# Patient Record
Sex: Male | Born: 2000 | Race: White | Hispanic: No | Marital: Single | Smoking: Former smoker
Health system: Southern US, Community
[De-identification: ages and names within clinical notes are randomized; demographics above are authoritative.]

## PROBLEM LIST (undated history)

## (undated) DIAGNOSIS — F988 Other specified behavioral and emotional disorders with onset usually occurring in childhood and adolescence: Secondary | ICD-10-CM

## (undated) DIAGNOSIS — K5909 Other constipation: Secondary | ICD-10-CM

## (undated) DIAGNOSIS — J45909 Unspecified asthma, uncomplicated: Secondary | ICD-10-CM

## (undated) HISTORY — DX: Other constipation: K59.09

## (undated) HISTORY — DX: Unspecified asthma, uncomplicated: J45.909

## (undated) HISTORY — DX: Other specified behavioral and emotional disorders with onset usually occurring in childhood and adolescence: F98.8

---
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
## 2018-04-11 ENCOUNTER — Encounter: Payer: Self-pay | Admitting: Nurse Practitioner

## 2018-04-11 ENCOUNTER — Ambulatory Visit (INDEPENDENT_AMBULATORY_CARE_PROVIDER_SITE_OTHER): Payer: No Typology Code available for payment source | Admitting: Nurse Practitioner

## 2018-04-11 VITALS — BP 120/78 | Temp 98.5°F | Ht 69.0 in | Wt 132.0 lb

## 2018-04-11 DIAGNOSIS — J069 Acute upper respiratory infection, unspecified: Secondary | ICD-10-CM | POA: Diagnosis not present

## 2018-04-11 DIAGNOSIS — B9689 Other specified bacterial agents as the cause of diseases classified elsewhere: Secondary | ICD-10-CM | POA: Diagnosis not present

## 2018-04-11 MED ORDER — AZITHROMYCIN 250 MG PO TABS: ORAL_TABLET | ORAL | 0 refills | Status: DC

## 2018-04-11 NOTE — Progress Notes (Signed)
Subjective: Presents for complaints of congestion and cough for the past 3 to 4 days.  His mother is present today per his request.  No fever.  Sore throat.  Headache.  Runny nose.  Frequent cough producing white to yellow sputum.  Slight chest burning with cough.  Minimal wheezing.  No ear pain.  No vomiting diarrhea or abdominal pain.  No reflux symptoms.  Objective:   BP 120/78   Temp 98.5 F (36.9 C)   Ht 5\' 9"  (1.753 m)   Wt 132 lb 0.2 oz (59.9 kg)   BMI 19.49 kg/m  NAD.  Alert, oriented.  TMs clear effusion, no erythema.  Pharynx mildly injected with PND noted.  Neck supple with mild soft anterior adenopathy.  Lungs clear.  Heart regular rate and rhythm.  Abdomen soft nontender.  Assessment:  Bacterial upper respiratory infection    Plan:   Meds ordered this encounter  Medications  . azithromycin (ZITHROMAX Z-PAK) 250 MG tablet    Sig: Take 2 tablets (500 mg) on  Day 1,  followed by 1 tablet (250 mg) once daily on Days 2 through 5.    Dispense:  6 each    Refill:  0    Order Specific Question:   Supervising Provider    Answer:   Merlyn AlbertLUKING, WILLIAM S [[id number]]   OTC meds as directed for congestion and cough.  Call back if symptoms worsen or persist.

## 2018-05-22 ENCOUNTER — Ambulatory Visit (INDEPENDENT_AMBULATORY_CARE_PROVIDER_SITE_OTHER): Payer: Self-pay | Admitting: Family Medicine

## 2018-05-22 ENCOUNTER — Encounter: Payer: Self-pay | Admitting: Family Medicine

## 2018-05-22 VITALS — BP 110/72 | Temp 97.7°F | Ht 69.0 in | Wt 129.2 lb

## 2018-05-22 DIAGNOSIS — R21 Rash and other nonspecific skin eruption: Secondary | ICD-10-CM

## 2018-05-22 DIAGNOSIS — W57XXXA Bitten or stung by nonvenomous insect and other nonvenomous arthropods, initial encounter: Secondary | ICD-10-CM

## 2018-05-22 MED ORDER — TRIAMCINOLONE ACETONIDE 0.1 % EX CREA: TOPICAL_CREAM | CUTANEOUS | 1 refills | Status: DC

## 2018-05-22 MED ORDER — PREDNISONE 20 MG PO TABS: ORAL_TABLET | ORAL | 0 refills | Status: DC

## 2018-05-22 NOTE — Progress Notes (Signed)
   Subjective:    Patient ID: Jeff Moreno, male    DOB: 02-28-2001, 17 y.o.   MRN: [id number]  Rash  This is a new problem. The current episode started in the past 7 days. The affected locations include the left foot, left ankle, left lower leg, right lower leg, right ankle and right foot. The rash is characterized by redness and itchiness. Past treatments include nothing.   Rash started Saturday to bilateral feet, ankles, and lower legs and including in between toes. Denies rash in any other locations. Reports significant pruritus. No one else in the home with same. Reports hiking this weekend. Has not tried anything at home for treatment.   Review of Systems  Skin: Positive for rash.       Objective:   Physical Exam  Constitutional: He is oriented to person, place, and time. He appears well-developed and well-nourished. No distress.  HENT:  Head: Normocephalic and atraumatic.  Eyes: Right eye exhibits no discharge. Left eye exhibits no discharge.  Neck: Neck supple.  Neurological: He is alert and oriented to person, place, and time.  Skin: Skin is warm and dry.  Multiple discrete small bug bites noted in webspace between toes, around bilateral feet and ankles and lower legs. No signs of infection noted.   Psychiatric: He has a normal mood and affect.  Vitals reviewed.      Assessment & Plan:  Bug bite, initial encounter - Plan: predniSONE (DELTASONE) 20 MG tablet, triamcinolone cream (KENALOG) 0.1 %  Given the presentation and history, likely from straw mites.  Rx sent in for prednisone taper and kenalog cream for itching.  Discussed that itching may take a few days to resolve.  Educated on wearing protective DEET bug spray next time he is outside in the woods or hiking, pt verbalized understanding. F/u prn. As attending physician to this patient visit, this patient was seen in conjunction with the nurse practitioner.  The history,physical and treatment plan was reviewed with  the nurse practitioner and pertinent findings were verified with the patient.  Also the treatment plan was reviewed with the patient while they were present.

## 2018-11-14 ENCOUNTER — Encounter: Payer: Self-pay | Admitting: Family Medicine

## 2018-11-14 ENCOUNTER — Ambulatory Visit (INDEPENDENT_AMBULATORY_CARE_PROVIDER_SITE_OTHER): Payer: Self-pay | Admitting: Family Medicine

## 2018-11-14 VITALS — BP 108/80 | Ht 70.0 in | Wt 141.0 lb

## 2018-11-14 DIAGNOSIS — F411 Generalized anxiety disorder: Secondary | ICD-10-CM

## 2018-11-14 MED ORDER — VENLAFAXINE HCL ER 150 MG PO CP24: 150.0000 mg | ORAL_CAPSULE | Freq: Every day | ORAL | 4 refills | Status: DC

## 2018-11-14 MED ORDER — TRAZODONE HCL 50 MG PO TABS: 50.0000 mg | ORAL_TABLET | Freq: Every day | ORAL | 4 refills | Status: DC

## 2018-11-14 NOTE — Progress Notes (Signed)
   Subjective:    Patient ID: Jeff Moreno, male    DOB: August 16, 2001, 18 y.o.   MRN: [id number]  HPI Patient is here today to discuss medications that the [employer] placed him on. This patient had problems with depression of moods when he was in the [employer] he has been discharged he states he is doing much better now he does admit to occasionally smoking marijuana but denies abusing any drugs He states he was started on Venlafaxine 150 mg one daily for moods, and also Trazodone 50 mg one po Qhs for mood disorders.  He states he is currently taking these. He is no longer in [employer], and wanted advise as to whether or not he should continue these, and needs refills.  Review of Systems  Constitutional: Negative for activity change, appetite change and fatigue.  HENT: Negative for congestion and rhinorrhea.   Respiratory: Negative for cough and shortness of breath.   Cardiovascular: Negative for chest pain and leg swelling.  Gastrointestinal: Negative for abdominal pain, nausea and vomiting.  Neurological: Negative for dizziness and headaches.  Psychiatric/Behavioral: Negative for agitation and behavioral problems.       Objective:   Physical Exam Vitals signs reviewed.  Cardiovascular:     Rate and Rhythm: Normal rate and regular rhythm.     Heart sounds: Normal heart sounds. No murmur.  Pulmonary:     Effort: Pulmonary effort is normal.     Breath sounds: Normal breath sounds.  Lymphadenopathy:     Cervical: No cervical adenopathy.  Neurological:     Mental Status: He is alert.  Psychiatric:        Behavior: Behavior normal.           Assessment & Plan:  Generalized anxiety disorder with some element of depression doing much better now continue medication in 3 months reassess I recommend he reduce trazodone down to half a tablet each evening if he feels he is having any setbacks problems or become suicidal or overly anxious or depressed to follow-up immediately

## 2019-02-10 ENCOUNTER — Encounter (HOSPITAL_COMMUNITY): Payer: Self-pay | Admitting: *Deleted

## 2019-02-10 ENCOUNTER — Emergency Department (HOSPITAL_COMMUNITY)
Admission: EM | Admit: 2019-02-10 | Discharge: 2019-02-10 | Disposition: A | Discharge status: Home / Self Care | Payer: No Typology Code available for payment source | Attending: Emergency Medicine | Admitting: Emergency Medicine

## 2019-02-10 ENCOUNTER — Other Ambulatory Visit: Payer: Self-pay

## 2019-02-10 DIAGNOSIS — A084 Viral intestinal infection, unspecified: Secondary | ICD-10-CM | POA: Insufficient documentation

## 2019-02-10 DIAGNOSIS — R112 Nausea with vomiting, unspecified: Secondary | ICD-10-CM

## 2019-02-10 DIAGNOSIS — R197 Diarrhea, unspecified: Secondary | ICD-10-CM | POA: Insufficient documentation

## 2019-02-10 LAB — BASIC METABOLIC PANEL
Anion gap: 14 (ref 5–15)
BUN: 19 mg/dL (ref 6–20)
CO2: 21 mmol/L — ABNORMAL LOW (ref 22–32)
Calcium: 9.7 mg/dL (ref 8.9–10.3)
Chloride: 102 mmol/L (ref 98–111)
Creatinine, Ser: 1.08 mg/dL (ref 0.61–1.24)
GFR calc Af Amer: >60 mL/min (ref >60)
GFR calc non Af Amer: >60 mL/min (ref >60)
Glucose, Bld: 117 mg/dL — ABNORMAL HIGH (ref 70–99)
Potassium: 4.2 mmol/L (ref 3.5–5.1)
Sodium: 137 mmol/L (ref 135–145)

## 2019-02-10 LAB — CBC
HCT: 45.6 % (ref 39.0–52.0)
Hemoglobin: 15.7 g/dL (ref 13.0–17.0)
MCH: 28.4 pg (ref 26.0–34.0)
MCHC: 34.4 g/dL (ref 30.0–36.0)
MCV: 82.6 fL (ref 80.0–100.0)
Platelets: 190 10*3/uL (ref 150–400)
RBC: 5.52 MIL/uL (ref 4.22–5.81)
RDW: 13 % (ref 11.5–15.5)
WBC: 8.9 10*3/uL (ref 4.0–10.5)
nRBC: 0 % (ref 0.0–0.2)

## 2019-02-10 MED ORDER — SODIUM CHLORIDE 0.9 % IV BOLUS: 1000.0000 mL | Freq: Once | INTRAVENOUS | Status: DC

## 2019-02-10 MED ORDER — METFORMIN HCL 500 MG PO TABS: 500.0000 mg | ORAL_TABLET | Freq: Once | ORAL | Status: DC

## 2019-02-10 NOTE — ED Notes (Signed)
Pt given gingerale- tolerating well 

## 2019-02-10 NOTE — ED Triage Notes (Signed)
Pt unable to keep anything down for past 2 days. + emesis and diarrhea, denies sick contacts

## 2019-02-10 NOTE — Discharge Instructions (Addendum)
It was our pleasure to provide your ER care today - we hope that you feel better.  Drink plenty of fluids.  Follow up with primary care doctor in the next few days if symptoms fail to improve/resolve.  Return to ER if worse, new symptoms, fevers, persistent vomiting, new or severe pain, other concern.

## 2019-02-10 NOTE — ED Provider Notes (Signed)
[facility] Provider Note   CSN: [id number] Arrival date & time: 02/10/19  [id number]    History   Chief Complaint Chief Complaint  Patient presents with  . Emesis    HPI Geanie Berlindam E Akhavan is a 18 y.o. male.     Patient c/o nausea, vomiting, and diarrhea, for the past 2 days. Symptoms acute onset, moderate, episodic, persistent. Emesis clear or color recently ingested fluids, not bloody or bilious. Diarrhea watery. Had mild intermittent diffuse abd cramping - that is currently resolved. No dysuria or gu c/o. No known ill contacts or known covid+ exposure. No known bad food ingestion. No recent abx or new med use. No fever or chills.   The history is provided by the patient.  Emesis  Associated symptoms: diarrhea   Associated symptoms: no cough, no fever, no headaches and no sore throat     Past Medical History:  Diagnosis Date  . ADD (attention deficit disorder)   . Asthma   . Chronic constipation     Patient Active Problem List   Diagnosis Date Noted  . Insomnia 04/17/2017  . Headache, common migraine 11/08/2015  . ADD (attention deficit disorder) 08/10/2015  . Acne vulgaris 05/19/2013    History reviewed. No pertinent surgical history.      Home Medications    Prior to Admission medications   Medication Sig Start Date End Date Taking? Authorizing Provider  azithromycin (ZITHROMAX Z-PAK) 250 MG tablet Take 2 tablets (500 mg) on  Day 1,  followed by 1 tablet (250 mg) once daily on Days 2 through 5. Patient not taking: Reported on 05/22/2018 04/11/18   Campbell RichesHoskins, Carolyn C, NP  predniSONE (DELTASONE) 20 MG tablet Take 3 tablets per day for 3 days, then 2 tablets per day for 3 days, then 1 tablet per day for 3 days. Patient not taking: Reported on 11/14/2018 05/22/18   Jeannine BogaWeekley, Lindsay, NP  traZODone (DESYREL) 50 MG tablet Take 1 tablet (50 mg total) by mouth at bedtime. 11/14/18   Babs SciaraLuking, Scott A, MD  triamcinolone cream (KENALOG) 0.1 % May apply to  affected area up to 2-3 times per day. Patient not taking: Reported on 11/14/2018 05/22/18   Jeannine BogaWeekley, Lindsay, NP  venlafaxine XR (EFFEXOR-XR) 150 MG 24 hr capsule Take 1 capsule (150 mg total) by mouth daily with breakfast. 11/14/18   Babs SciaraLuking, Scott A, MD    Family History History reviewed. No pertinent family history.  Social History Social History   Tobacco Use  . Smoking status: Former Games developermoker  . Smokeless tobacco: Never Used  Substance Use Topics  . Alcohol use: No  . Drug use: No     Allergies   Duricef [cefadroxil]   Review of Systems Review of Systems  Constitutional: Negative for fever.  HENT: Negative for sore throat.   Eyes: Negative for redness.  Respiratory: Negative for cough and shortness of breath.   Cardiovascular: Negative for chest pain.  Gastrointestinal: Positive for diarrhea and vomiting.  Genitourinary: Negative for dysuria and flank pain.  Musculoskeletal: Negative for back pain and neck pain.  Skin: Negative for rash.  Neurological: Negative for headaches.  Hematological: Does not bruise/bleed easily.  Psychiatric/Behavioral: Negative for confusion.     Physical Exam Updated Vital Signs BP (!) 134/92 (BP Location: Right Arm)   Pulse 69   Temp 98.1 F (36.7 C) (Temporal)   Resp 14   Ht 1.778 m (5\' 10" )   Wt 56.7 kg   SpO2 99%   BMI  17.94 kg/m   Physical Exam Vitals signs and nursing note reviewed.  Constitutional:      Appearance: Normal appearance. He is well-developed.  HENT:     Head: Atraumatic.     Nose: Nose normal.     Mouth/Throat:     Mouth: Mucous membranes are moist.     Pharynx: Oropharynx is clear.  Eyes:     General: No scleral icterus.    Conjunctiva/sclera: Conjunctivae normal.     Pupils: Pupils are equal, round, and reactive to light.  Neck:     Musculoskeletal: Normal range of motion and neck supple. No neck rigidity.     Trachea: No tracheal deviation.  Cardiovascular:     Rate and Rhythm: Normal rate and  regular rhythm.     Pulses: Normal pulses.     Heart sounds: Normal heart sounds. No murmur. No friction rub. No gallop.   Pulmonary:     Effort: Pulmonary effort is normal. No accessory muscle usage or respiratory distress.     Breath sounds: Normal breath sounds.  Abdominal:     General: Bowel sounds are normal. There is no distension.     Palpations: Abdomen is soft. There is no mass.     Tenderness: There is no abdominal tenderness. There is no guarding or rebound.     Hernia: No hernia is present.  Genitourinary:    Comments: No cva tenderness. Musculoskeletal:        General: No swelling.  Skin:    General: Skin is warm and dry.     Findings: No rash.  Neurological:     Mental Status: He is alert.     Comments: Alert, speech clear. Steady gait.  Psychiatric:        Mood and Affect: Mood normal.      ED Treatments / Results  Labs (all labs ordered are listed, but only abnormal results are displayed) Results for orders placed or performed during the hospital encounter of 02/10/19  CBC  Result Value Ref Range   WBC 8.9 4.0 - 10.5 K/uL   RBC 5.52 4.22 - 5.81 MIL/uL   Hemoglobin 15.7 13.0 - 17.0 g/dL   HCT 67.6 72.0 - 94.7 %   MCV 82.6 80.0 - 100.0 fL   MCH 28.4 26.0 - 34.0 pg   MCHC 34.4 30.0 - 36.0 g/dL   RDW 09.6 28.3 - 66.2 %   Platelets 190 150 - 400 K/uL   nRBC 0.0 0.0 - 0.2 %  Basic metabolic panel  Result Value Ref Range   Sodium 137 135 - 145 mmol/L   Potassium 4.2 3.5 - 5.1 mmol/L   Chloride 102 98 - 111 mmol/L   CO2 21 (L) 22 - 32 mmol/L   Glucose, Bld 117 (H) 70 - 99 mg/dL   BUN 19 6 - 20 mg/dL   Creatinine, Ser 9.47 0.61 - 1.24 mg/dL   Calcium 9.7 8.9 - 65.4 mg/dL   GFR calc non Af Amer >60 >60 mL/min   GFR calc Af Amer >60 >60 mL/min   Anion gap 14 5 - 15    EKG None  Radiology No results found.  Procedures Procedures (including critical care time)  Medications Ordered in ED Medications  sodium chloride 0.9 % bolus 1,000 mL (has no  administration in time range)     Initial Impression / Assessment and Plan / ED Course  I have reviewed the triage vital signs and the nursing notes.  Pertinent labs & imaging  results that were available during my care of the patient were reviewed by me and considered in my medical decision making (see chart for details).  Iv ns bolus. Labs sent.   Reviewed nursing notes and prior charts for additional history.   Trial of po fluids.  Labs reviewed by me - chem normal.   Patient is tolerating po fluids. No recurrent nvd. Afebrile. abd soft nt.  Pt appears stable for d/c.   Return precautions provided.     Final Clinical Impressions(s) / ED Diagnoses   Final diagnoses:  None    ED Discharge Orders    None       Cathren Laine, MD 02/10/19 2052

## 2019-02-11 ENCOUNTER — Encounter: Payer: Self-pay | Admitting: Family Medicine

## 2019-02-11 ENCOUNTER — Telehealth: Payer: Self-pay | Admitting: *Deleted

## 2019-02-11 ENCOUNTER — Ambulatory Visit (INDEPENDENT_AMBULATORY_CARE_PROVIDER_SITE_OTHER): Payer: Self-pay | Admitting: Family Medicine

## 2019-02-11 DIAGNOSIS — B349 Viral infection, unspecified: Secondary | ICD-10-CM

## 2019-02-11 DIAGNOSIS — Z20822 Contact with and (suspected) exposure to covid-19: Secondary | ICD-10-CM

## 2019-02-11 MED ORDER — ONDANSETRON 4 MG PO TBDP: 4.0000 mg | ORAL_TABLET | Freq: Three times a day (TID) | ORAL | 0 refills | Status: DC | PRN

## 2019-02-11 NOTE — Progress Notes (Signed)
   Subjective:    Patient ID: Jeff Moreno, male    DOB: 19-Aug-2001, 18 y.o.   MRN: [id number] Audio plus video Emesis   This is a new problem. Episode onset: 2 days ago. Maximum temperature: 99.9. Associated symptoms include abdominal pain, chills, diarrhea and a fever. He has tried nothing for the symptoms.   Virtual Visit via Video Note  I connected with Jeff Moreno on 02/11/19 at  2:00 PM EDT by a video enabled telemedicine application and verified that I am speaking with the correct person using two identifiers.  Location: Patient: home Provider: office   I discussed the limitations of evaluation and management by telemedicine and the availability of in person appointments. The patient expressed understanding and agreed to proceed.  History of Present Illness:    Observations/Objective:   Assessment and Plan:   Follow Up Instructions:    I discussed the assessment and treatment plan with the patient. The patient was provided an opportunity to ask questions and all were answered. The patient agreed with the plan and demonstrated an understanding of the instructions.   The patient was advised to call back or seek an in-person evaluation if the symptoms worsen or if the condition fails to improve as anticipated.  I provided 18 minutes of non-face-to-face time during this encounter.  Mostly GI symptoms.  Diminished energy.  Low-grade fever.  Nausea.  Abdominal cramps.  Diarrhea.  2-1/2 days duration.  Around no one else sick. Marlowe Sh[place], LPN    Review of Systems  Constitutional: Positive for chills and fever.  Gastrointestinal: Positive for abdominal pain, diarrhea and vomiting.       Objective:   Physical Exam   Virtual     Assessment & Plan:  Impression probable viral GE presentation.  However his workplace is insisting on coronavirus test.  We will press on and do this symptom care discussed Zofran as needed for diarrhea.  Warning signs discussed

## 2019-02-11 NOTE — Telephone Encounter (Signed)
Pt scheduled for Covid 19 testing (02/12/19 at 0945 at [facility] in [place]. Pt instructed that he needs to stay in car for the testing and that he needs to wear mask. Pt advised that since he is self pay, he will receive a bill from [facility]. Pt's MyChart reactivated. Order placed.

## 2019-02-11 NOTE — Telephone Encounter (Signed)
Patient seen by virtual visit by Dr Lubertha South- cough, gi symptoms,fever- needs Covid 19 testing before can return to work  Phone [phone]

## 2019-02-12 ENCOUNTER — Other Ambulatory Visit: Payer: Self-pay

## 2019-02-12 DIAGNOSIS — Z20822 Contact with and (suspected) exposure to covid-19: Secondary | ICD-10-CM

## 2019-02-13 LAB — NOVEL CORONAVIRUS, NAA: SARS-CoV-2, NAA: NOT DETECTED

## 2019-02-15 ENCOUNTER — Ambulatory Visit: Payer: Self-pay | Admitting: Family Medicine

## 2019-02-19 ENCOUNTER — Telehealth: Payer: Self-pay | Admitting: Family Medicine

## 2019-02-19 ENCOUNTER — Encounter: Payer: Self-pay | Admitting: Family Medicine

## 2019-02-19 NOTE — Telephone Encounter (Signed)
Test is negative may return to work

## 2019-02-19 NOTE — Telephone Encounter (Signed)
Done. Mom picked up note

## 2019-02-19 NOTE — Telephone Encounter (Signed)
Pt's mom calling to see if Covid results are back. Pt is needing to go back to work.

## 2019-02-19 NOTE — Telephone Encounter (Signed)
Pt contacted and verbalized understanding. Pt is needing note to return to work stating that test is negative. Please advise. Thank you

## 2019-02-19 NOTE — Telephone Encounter (Signed)
Please advise. Thank you

## 2019-05-10 ENCOUNTER — Other Ambulatory Visit: Payer: Self-pay

## 2019-05-10 DIAGNOSIS — Z20822 Contact with and (suspected) exposure to covid-19: Secondary | ICD-10-CM

## 2019-05-12 LAB — NOVEL CORONAVIRUS, NAA: SARS-CoV-2, NAA: NOT DETECTED

## 2019-05-24 ENCOUNTER — Emergency Department (HOSPITAL_COMMUNITY)
Admission: EM | Admit: 2019-05-24 | Discharge: 2019-05-24 | Disposition: A | Discharge status: Home / Self Care | Payer: Self-pay | Attending: Emergency Medicine | Admitting: Emergency Medicine

## 2019-05-24 ENCOUNTER — Encounter (HOSPITAL_COMMUNITY): Payer: Self-pay | Admitting: Emergency Medicine

## 2019-05-24 ENCOUNTER — Emergency Department (HOSPITAL_COMMUNITY): Payer: Self-pay

## 2019-05-24 ENCOUNTER — Other Ambulatory Visit: Payer: Self-pay

## 2019-05-24 DIAGNOSIS — F909 Attention-deficit hyperactivity disorder, unspecified type: Secondary | ICD-10-CM | POA: Insufficient documentation

## 2019-05-24 DIAGNOSIS — J45909 Unspecified asthma, uncomplicated: Secondary | ICD-10-CM | POA: Insufficient documentation

## 2019-05-24 DIAGNOSIS — F1210 Cannabis abuse, uncomplicated: Secondary | ICD-10-CM | POA: Insufficient documentation

## 2019-05-24 DIAGNOSIS — R059 Cough, unspecified: Secondary | ICD-10-CM

## 2019-05-24 DIAGNOSIS — R05 Cough: Secondary | ICD-10-CM | POA: Insufficient documentation

## 2019-05-24 DIAGNOSIS — Z79899 Other long term (current) drug therapy: Secondary | ICD-10-CM | POA: Insufficient documentation

## 2019-05-24 DIAGNOSIS — J029 Acute pharyngitis, unspecified: Secondary | ICD-10-CM | POA: Insufficient documentation

## 2019-05-24 DIAGNOSIS — Z87891 Personal history of nicotine dependence: Secondary | ICD-10-CM | POA: Insufficient documentation

## 2019-05-24 LAB — GROUP A STREP BY PCR: Group A Strep by PCR: NOT DETECTED

## 2019-05-24 NOTE — ED Triage Notes (Signed)
Sore throat, seen bumps in the back of his throat today.  Also reports feeling sharp pain in back of chest with breathing.

## 2019-05-24 NOTE — Discharge Instructions (Signed)
Take over the counter tylenol and ibuprofen, as directed on packaging, as needed for discomfort. Gargle with warm water several times per day to help with discomfort.  May also use over the counter sore throat pain medicines such as chloraseptic or sucrets, as directed on packaging, as needed for discomfort.  Call your regular medical doctor today to schedule a follow up appointment next week.  Return to the Emergency Department immediately if worsening. ° °

## 2019-05-24 NOTE — ED Provider Notes (Signed)
[facility] EMERGENCY DEPARTMENT Provider Note   CSN: [id number] Arrival date & time: 05/24/19  1124     History   Chief Complaint Chief Complaint  Patient presents with  . Sore Throat    HPI AMARIUS TOTO is a 18 y.o. male.     HPI  Pt was seen at 1145.  Per pt, c/o gradual onset and persistence of constant sore throat and cough for the past 2-3 days. States his chest hurts when he coughs only.  Denies sick contacts. Denies fevers, no rash, no CP/palpitations, no SOB, no N/V/D, no abd pain.      Past Medical History:  Diagnosis Date  . ADD (attention deficit disorder)   . Asthma   . Chronic constipation     Patient Active Problem List   Diagnosis Date Noted  . Insomnia 04/17/2017  . Headache, common migraine 11/08/2015  . ADD (attention deficit disorder) 08/10/2015  . Acne vulgaris 05/19/2013    History reviewed. No pertinent surgical history.      Home Medications    Prior to Admission medications   Medication Sig Start Date End Date Taking? Authorizing Provider  azithromycin (ZITHROMAX Z-PAK) 250 MG tablet Take 2 tablets (500 mg) on  Day 1,  followed by 1 tablet (250 mg) once daily on Days 2 through 5. Patient not taking: Reported on 05/22/2018 04/11/18   Nilda Simmer, NP  ondansetron (ZOFRAN ODT) 4 MG disintegrating tablet Take 1 tablet (4 mg total) by mouth every 8 (eight) hours as needed for nausea or vomiting. 02/11/19   Mikey Kirschner, MD  predniSONE (DELTASONE) 20 MG tablet Take 3 tablets per day for 3 days, then 2 tablets per day for 3 days, then 1 tablet per day for 3 days. Patient not taking: Reported on 11/14/2018 05/22/18   Cheyenne Adas, NP  traZODone (DESYREL) 50 MG tablet Take 1 tablet (50 mg total) by mouth at bedtime. Patient not taking: Reported on 02/11/2019 11/14/18   Kathyrn Drown, MD  triamcinolone cream (KENALOG) 0.1 % May apply to affected area up to 2-3 times per day. Patient not taking: Reported on 11/14/2018 05/22/18   Cheyenne Adas, NP  venlafaxine XR (EFFEXOR-XR) 150 MG 24 hr capsule Take 1 capsule (150 mg total) by mouth daily with breakfast. 11/14/18   Kathyrn Drown, MD    Family History No family history on file.  Social History Social History   Tobacco Use  . Smoking status: Former Research scientist (life sciences)  . Smokeless tobacco: Never Used  Substance Use Topics  . Alcohol use: No  . Drug use: Yes    Frequency: 2.0 times per week    Types: Marijuana     Allergies   Duricef [cefadroxil]   Review of Systems Review of Systems ROS: Statement: All systems negative except as marked or noted in the HPI; Constitutional: Negative for fever and chills. ; ; Eyes: Negative for eye pain, redness and discharge. ; ; ENMT: Negative for ear pain, hoarseness, nasal congestion, sinus pressure and +sore throat. ; ; Cardiovascular: Negative for chest pain, palpitations, diaphoresis, dyspnea and peripheral edema. ; ; Respiratory: +cough. Negative for wheezing and stridor. ; ; Gastrointestinal: Negative for nausea, vomiting, diarrhea, abdominal pain, blood in stool, hematemesis, jaundice and rectal bleeding. . ; ; Genitourinary: Negative for dysuria, flank pain and hematuria. ; ; Musculoskeletal: Negative for back pain and neck pain. Negative for swelling and trauma.; ; Skin: Negative for pruritus, rash, abrasions, blisters, bruising and skin lesion.; ;  Neuro: Negative for headache, lightheadedness and neck stiffness. Negative for weakness, altered level of consciousness, altered mental status, extremity weakness, paresthesias, involuntary movement, seizure and syncope.       Physical Exam Updated Vital Signs BP 124/73 (BP Location: Right Arm)   Pulse 98   Temp 99.6 F (37.6 C) (Oral)   Resp 18   Ht 5\' 10"  (1.778 m)   Wt 59 kg   SpO2 100%   BMI 18.65 kg/m   Physical Exam 1150: Physical examination:  Nursing notes reviewed; Vital signs and O2 SAT reviewed;  Constitutional: Well developed, Well nourished, Well hydrated, In no  acute distress; Head:  Normocephalic, atraumatic; Eyes: EOMI, PERRL, No scleral icterus; ENMT: TM's clear bilat. +edemetous nasal turbinates bilat with clear rhinorrhea. Mouth and pharynx without lesions. No tonsillar exudates. No intra-oral edema. No submandibular or sublingual edema. No hoarse voice, no drooling, no stridor. No pain with manipulation of larynx. No trismus. Mouth and pharynx normal, Mucous membranes moist; Neck: Supple, Full range of motion, No lymphadenopathy; Cardiovascular: Regular rate and rhythm, No gallop; Respiratory: Breath sounds clear & equal bilaterally, No wheezes.  Speaking full sentences with ease, Normal respiratory effort/excursion; Chest: Nontender, Movement normal; Abdomen: Soft, Nontender, Nondistended, Normal bowel sounds; Genitourinary: No CVA tenderness; Extremities: Peripheral pulses normal, No tenderness, No edema, No calf edema or asymmetry.; Neuro: AA&Ox3, Major CN grossly intact.  Speech clear. No gross focal motor or sensory deficits in extremities.; Skin: Color normal, Warm, Dry.   ED Treatments / Results  Labs (all labs ordered are listed, but only abnormal results are displayed)   EKG None  Radiology   Procedures Procedures (including critical care time)  Medications Ordered in ED Medications - No data to display   Initial Impression / Assessment and Plan / ED Course  I have reviewed the triage vital signs and the nursing notes.  Pertinent labs & imaging results that were available during my care of the patient were reviewed by me and considered in my medical decision making (see chart for details).     MDM Reviewed: previous chart, nursing note and vitals Interpretation: labs and x-ray    Results for orders placed or performed during the hospital encounter of 05/24/19  Group A Strep by PCR   Specimen: Throat; Sterile Swab  Result Value Ref Range   Group A Strep by PCR NOT DETECTED NOT DETECTED   Dg Chest Port 1 View Result  Date: 05/24/2019 CLINICAL DATA:  Productive cough. EXAM: PORTABLE CHEST 1 VIEW COMPARISON:  07/06/2014 FINDINGS: The heart size and mediastinal contours are within normal limits. Both lungs are clear. The visualized skeletal structures are unremarkable. IMPRESSION: Normal exam. Electronically Signed   By: Francene Boyers M.D.   On: 05/24/2019 12:50     ARDAN ASTA was evaluated in Emergency Department on 05/24/2019 for the symptoms described in the history of present illness. He was evaluated in the context of the global COVID-19 pandemic, which necessitated consideration that the patient might be at risk for infection with the SARS-CoV-2 virus that causes COVID-19. Institutional protocols and algorithms that pertain to the evaluation of patients at risk for COVID-19 are in a state of rapid change based on information released by regulatory bodies including the CDC and federal and state organizations. These policies and algorithms were followed during the patient's care in the ED.   1305:  Workup reassuring. PERC negative.  Tx symptomatically at this time. Dx and testing, as well as incidental finding(s), d/w pt and  family.  Questions answered.  Verb understanding, agreeable to d/c home with outpt f/u.      Final Clinical Impressions(s) / ED Diagnoses   Final diagnoses:  None    ED Discharge Orders    None       Samuel JesterMcManus, Halea Lieb, DO 05/28/19 1037

## 2019-05-24 NOTE — ED Notes (Signed)
Patient presents with sore throat, productive cough with yellow tinged sputum, and mild chest pain upon coughing.

## 2019-10-30 IMAGING — CR DG CHEST 1V PORT
1 series · 1 of 1 positions shown · non-contrast
Comparison: 07/06/2014

CLINICAL DATA: Productive cough.

EXAM:
PORTABLE CHEST 1 VIEW

[portable]
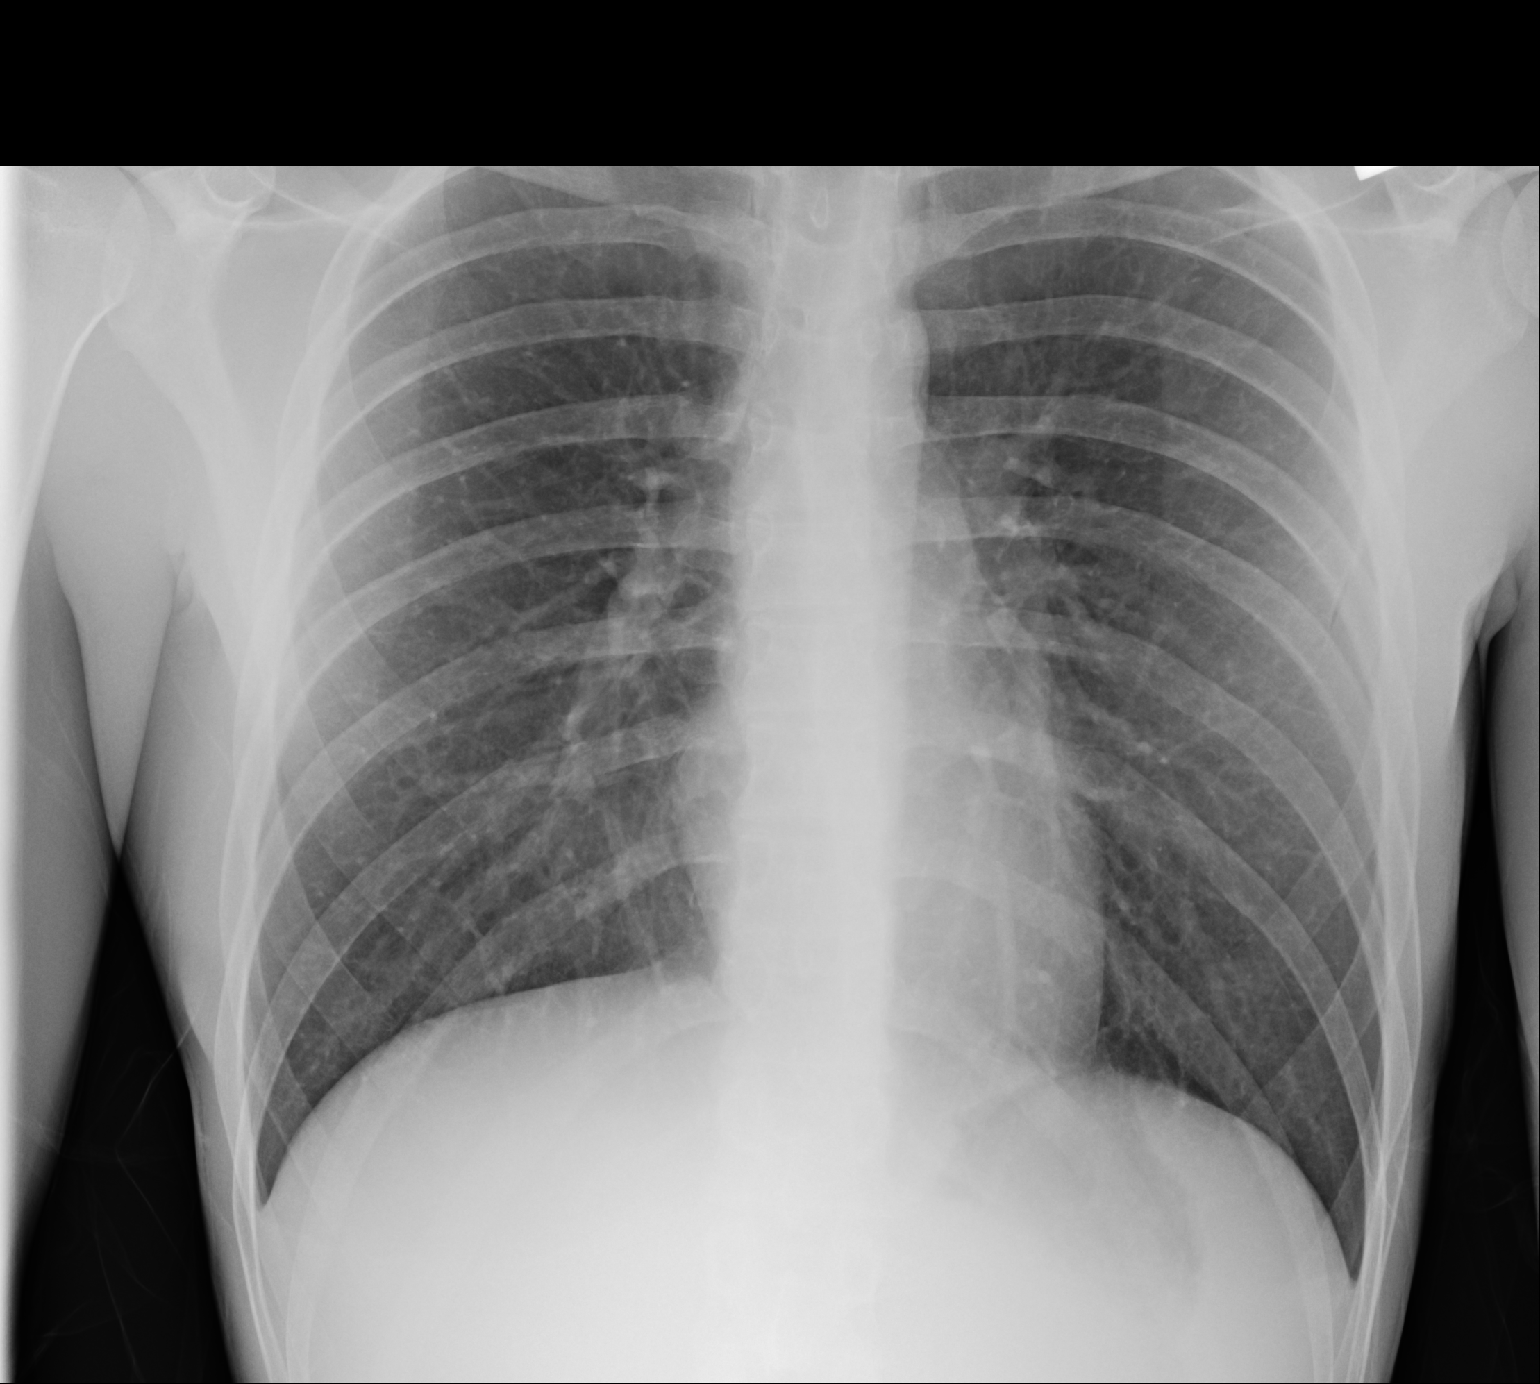

[1 of 1 positions shown; findings below may reference images not displayed]

FINDINGS: The heart size and mediastinal contours are within normal limits.
Both lungs are clear. The visualized skeletal structures are
unremarkable.
IMPRESSION: Normal exam.

## 2020-07-20 ENCOUNTER — Other Ambulatory Visit: Payer: Self-pay

## 2020-07-20 ENCOUNTER — Ambulatory Visit (INDEPENDENT_AMBULATORY_CARE_PROVIDER_SITE_OTHER): Payer: Self-pay | Admitting: Family Medicine

## 2020-07-20 ENCOUNTER — Encounter: Payer: Self-pay | Admitting: Family Medicine

## 2020-07-20 VITALS — HR 69 | Temp 98.5°F | Resp 18

## 2020-07-20 DIAGNOSIS — R112 Nausea with vomiting, unspecified: Secondary | ICD-10-CM

## 2020-07-20 MED ORDER — ONDANSETRON 4 MG PO TBDP: 4.0000 mg | ORAL_TABLET | Freq: Three times a day (TID) | ORAL | 0 refills | Status: DC | PRN

## 2020-07-20 NOTE — Progress Notes (Signed)
Pt vomited and had headache last night at work. Pt states headache has subsided. Needing to make sure he does not have COVID. No fever.     Patient ID: Jeff Moreno, male    DOB: 03-Dec-2000, 19 y.o.   MRN: [id number]   Chief Complaint  Patient presents with  . Headache   Subjective:  CC: vomited x 2, needs to rule-out Covid for work requirement  Presents today as an outside visit, vomited twice. His last episode of vomiting was at 2:00 this morning. He is keeping fluids down . Associated symptoms include a slight headache yesterday nausea, vomiting, dry heaves. Denies fever denies abdominal pain. Reports he has no appetite. Continues to have slight nausea. His work is requiring that he tested negative for Covid before returning to work.    Medical History Klein has a past medical history of ADD (attention deficit disorder), Asthma, and Chronic constipation.   Outpatient Encounter Medications as of 07/20/2020  Medication Sig  . ondansetron (ZOFRAN ODT) 4 MG disintegrating tablet Take 1 tablet (4 mg total) by mouth every 8 (eight) hours as needed for nausea or vomiting.  . traZODone (DESYREL) 50 MG tablet Take 1 tablet (50 mg total) by mouth at bedtime.  . triamcinolone cream (KENALOG) 0.1 % May apply to affected area up to 2-3 times per day.  . venlafaxine XR (EFFEXOR-XR) 150 MG 24 hr capsule Take 1 capsule (150 mg total) by mouth daily with breakfast.  . [DISCONTINUED] azithromycin (ZITHROMAX Z-PAK) 250 MG tablet Take 2 tablets (500 mg) on  Day 1,  followed by 1 tablet (250 mg) once daily on Days 2 through 5.  . [DISCONTINUED] ondansetron (ZOFRAN ODT) 4 MG disintegrating tablet Take 1 tablet (4 mg total) by mouth every 8 (eight) hours as needed for nausea or vomiting.  . [DISCONTINUED] predniSONE (DELTASONE) 20 MG tablet Take 3 tablets per day for 3 days, then 2 tablets per day for 3 days, then 1 tablet per day for 3 days.   No facility-administered encounter medications on file as of  07/20/2020.     Review of Systems  Constitutional: Negative for chills and fever.  Respiratory: Negative for shortness of breath.   Cardiovascular: Negative for chest pain.  Gastrointestinal: Positive for nausea and vomiting. Negative for abdominal pain.  Neurological: Positive for headaches.     Vitals Pulse 69   Temp 98.5 F (36.9 C)   Resp 18   SpO2 99%   Objective:   Physical Exam Constitutional:      General: He is not in acute distress.    Appearance: He is well-developed. He is not ill-appearing.  HENT:     Right Ear: Tympanic membrane normal.     Left Ear: Tympanic membrane normal.     Mouth/Throat:     Mouth: Mucous membranes are moist.     Pharynx: Oropharynx is clear.  Cardiovascular:     Rate and Rhythm: Regular rhythm.     Heart sounds: Normal heart sounds.  Pulmonary:     Effort: Pulmonary effort is normal.     Breath sounds: Normal breath sounds.  Abdominal:     General: Bowel sounds are normal.  Skin:    General: Skin is warm and dry.  Neurological:     Mental Status: He is alert and oriented to person, place, and time.  Psychiatric:        Mood and Affect: Mood normal.        Behavior: Behavior normal.  Thought Content: Thought content normal.        Judgment: Judgment normal.      Assessment and Plan   1. Nausea and vomiting in adult - Novel Coronavirus, NAA ([facility]) - ondansetron (ZOFRAN ODT) 4 MG disintegrating tablet; Take 1 tablet (4 mg total) by mouth every 8 (eight) hours as needed for nausea or vomiting.  Dispense: 20 tablet; Refill: 0   Is much better, last vomited at 2:00 this morning. Able to keep fluids down, still complaining of slight nausea, affecting him from eating. We will send Zofran to assist with hydration, and eating. Wants to go back to work, needs negative Covid test.  Agrees with plan of care discussed today. Understands warning signs to seek further care: Inability to keep fluids down, chest pain, shortness  of breath. Understands to follow-up if symptoms do not improve, or worsen. Will notify him once Covid results are available. Work note given reflecting the need for a negative Covid test before returning.  Novella Olive, NP 07/20/2020

## 2020-07-22 LAB — SARS-COV-2, NAA 2 DAY TAT

## 2020-07-22 LAB — NOVEL CORONAVIRUS, NAA: SARS-CoV-2, NAA: NOT DETECTED

## 2020-07-23 ENCOUNTER — Encounter: Payer: Self-pay | Admitting: *Deleted

## 2020-11-26 ENCOUNTER — Ambulatory Visit: Payer: BC Managed Care – PPO | Admitting: Family Medicine

## 2020-11-26 ENCOUNTER — Encounter: Payer: Self-pay | Admitting: Family Medicine

## 2020-11-26 ENCOUNTER — Other Ambulatory Visit: Payer: Self-pay

## 2020-11-26 VITALS — HR 84 | Temp 98.8°F | Resp 16

## 2020-11-26 DIAGNOSIS — B9689 Other specified bacterial agents as the cause of diseases classified elsewhere: Secondary | ICD-10-CM | POA: Diagnosis not present

## 2020-11-26 DIAGNOSIS — J019 Acute sinusitis, unspecified: Secondary | ICD-10-CM

## 2020-11-26 MED ORDER — DOXYCYCLINE HYCLATE 100 MG PO TABS: 100.0000 mg | ORAL_TABLET | Freq: Two times a day (BID) | ORAL | 0 refills | Status: DC

## 2020-11-26 NOTE — Progress Notes (Signed)
Patient ID: Jeff REIERSON, male    DOB: 2000/11/18, 20 y.o.   MRN: [id number]   Chief Complaint  Patient presents with  . Headache   Subjective:  CC: sinus pain and pressure and headache  This is a new problem.  Presents today with a complaint of pain behind the eyes and on the top of the head.  Reports that he is having some sensitivity to light and loud noises and is overwhelming.  Symptoms started 4 days ago.  He denies fever and chills but says he has some congestion.  No chest pain no shortness of breath.  Has never had a migraine before, took Tylenol prior to this visit and headache is improved.   migraine for 4 days. Pain on top of head and behind eyes. Constant pain. Just took tylenol and it has eased up.   Coughing up yellow green mucus. Started 3 days.   No direct exposure to covid and no covid test since having symptoms.    Medical History Jeff Moreno has a past medical history of ADD (attention deficit disorder), Asthma, and Chronic constipation.   Outpatient Encounter Medications as of 11/26/2020  Medication Sig  . doxycycline (VIBRA-TABS) 100 MG tablet Take 1 tablet (100 mg total) by mouth 2 (two) times daily.  . [DISCONTINUED] ondansetron (ZOFRAN ODT) 4 MG disintegrating tablet Take 1 tablet (4 mg total) by mouth every 8 (eight) hours as needed for nausea or vomiting.  . [DISCONTINUED] traZODone (DESYREL) 50 MG tablet Take 1 tablet (50 mg total) by mouth at bedtime.  . [DISCONTINUED] triamcinolone cream (KENALOG) 0.1 % May apply to affected area up to 2-3 times per day.  . [DISCONTINUED] venlafaxine XR (EFFEXOR-XR) 150 MG 24 hr capsule Take 1 capsule (150 mg total) by mouth daily with breakfast.   No facility-administered encounter medications on file as of 11/26/2020.     Review of Systems  Constitutional: Negative for chills and fever.  HENT: Positive for congestion, sinus pressure and sinus pain.   Respiratory: Negative for shortness of breath.   Cardiovascular:  Negative for chest pain.  Gastrointestinal: Negative for abdominal pain.  Neurological: Positive for headaches.     Vitals Pulse 84   Temp 98.8 F (37.1 C)   Resp 16   SpO2 99%   Objective:   Physical Exam Vitals reviewed.  HENT:     Right Ear: Tympanic membrane normal.     Left Ear: Tympanic membrane normal.     Nose:     Left Turbinates: Swollen.     Right Sinus: Frontal sinus tenderness present.     Left Sinus: Frontal sinus tenderness present.     Mouth/Throat:     Pharynx: Uvula midline. Posterior oropharyngeal erythema present.     Tonsils: 1+ on the right. 1+ on the left.     Comments: Denies sore throat.  Cardiovascular:     Rate and Rhythm: Normal rate and regular rhythm.     Heart sounds: Normal heart sounds.  Pulmonary:     Effort: Pulmonary effort is normal.     Breath sounds: Normal breath sounds.  Skin:    General: Skin is warm and dry.  Neurological:     General: No focal deficit present.     Mental Status: He is alert.  Psychiatric:        Behavior: Behavior normal.      Assessment and Plan   1. Acute bacterial rhinosinusitis - doxycycline (VIBRA-TABS) 100 MG tablet; Take 1 tablet (100  mg total) by mouth 2 (two) times daily.  Dispense: 20 tablet; Refill: 0 - Novel Coronavirus, NAA ([facility])   Headache likely due to sinus infection.  Has frontal sinus pain upon examination today.  Will treat with doxycycline for 10 days.  He is instructed to perform sinus flushes, information given at discharge.  Agrees with plan of care discussed today. Understands warning signs to seek further care: chest pain, shortness of breath, any significant change in health.  Understands to follow-up if symptoms do not improve, or worsen.  Work note provided, returned to work date is November 29, 2020, his next scheduled shift.  Will notify once Covid results are available.  Dorena Bodo, NP 11/26/2020

## 2020-11-26 NOTE — Patient Instructions (Signed)
How to Perform a Sinus Rinse A sinus rinse is a home treatment. It rinses your sinuses with a mixture of salt and water (saline solution). Sinuses are air-filled spaces in your skull behind the bones of your face and forehead. They open into your nasal cavity. A sinus rinse can help to clear your nasal cavity. It can clear mucus, dirt, dust, or pollen. You may do a sinus rinse when you have:  A cold.  A virus.  Allergies.  A sinus infection.  A stuffy nose. Talk with your doctor about whether a sinus rinse might help you. What are the risks? A sinus rinse is normally very safe and helpful. However, there are a few risks. These include:  A burning feeling in the sinuses. This may happen if you do not make the saline solution as instructed. Be sure to follow all directions when making the saline solution.  Nasal irritation.  Infection from unclean water. This is rare, but possible. Do not do a sinus rinse if you have had:  Ear or nasal surgery.  An ear infection.  Blocked ears. Supplies needed:  Saline solution or powder.  Distilled or germ-free (sterile) water may be needed to mix with saline powder. ? You may use boiled and cooled tap water. Boil tap water for 5 minutes; cool until it is lukewarm. Use within 24 hours. ? Do not use regular tap water to mix with the saline solution.  Neti pot or nasal rinse bottle. This releases the saline solution into your nose and through your sinuses. You can buy neti pots and rinse bottles: ? At your local pharmacy. ? At a health food store. ? Online. How to perform a sinus rinse 1. Wash your hands with soap and water. 2. Wash your device using the directions that came with it. 3. Dry your device. 4. Use the solution that comes with your device or one that is sold separately in stores. Follow the mixing directions on the package if you need to mix with sterile or distilled water. 5. Fill your device with the amount of saline solution  stated in the device instructions. 6. Stand over a sink and tilt your head sideways over the sink. 7. Place the spout of the device in your upper nostril (the one closer to the ceiling). 8. Gently pour or squeeze the saline solution into your nasal cavity. The liquid should drain to your lower nostril if you are not too stuffed up (congested). 9. While rinsing, breathe through your open mouth. 10. Gently blow your nose to clear any mucus and rinse solution. Blowing too hard may cause ear pain. 11. Repeat in your other nostril. 12. Clean and rinse your device with clean water. 13. Air-dry your device. Talk with your doctor or pharmacist if you have questions about how to do a sinus rinse.   Summary  A sinus rinse is a home treatment. It rinses your sinuses with a mixture of salt and water (saline solution).  A sinus rinse is normally very safe and helpful. Follow all instructions carefully.  Talk with your doctor about whether a sinus rinse might help you. This information is not intended to replace advice given to you by your health care provider. Make sure you discuss any questions you have with your health care provider. Document Revised: 06/09/2020 Document Reviewed: 06/09/2020 Elsevier Patient Education  2021 [employer] Sinusitis, Adult Sinusitis is inflammation of your sinuses. Sinuses are hollow spaces in the bones around your face. Your sinuses  are located:  Around your eyes.  In the middle of your forehead.  Behind your nose.  In your cheekbones. Mucus normally drains out of your sinuses. When your nasal tissues become inflamed or swollen, mucus can become trapped or blocked. This allows bacteria, viruses, and fungi to grow, which leads to infection. Most infections of the sinuses are caused by a virus. Sinusitis can develop quickly. It can last for up to 4 weeks (acute) or for more than 12 weeks (chronic). Sinusitis often develops after a cold. What are the causes? This  condition is caused by anything that creates swelling in the sinuses or stops mucus from draining. This includes:  Allergies.  Asthma.  Infection from bacteria or viruses.  Deformities or blockages in your nose or sinuses.  Abnormal growths in the nose (nasal polyps).  Pollutants, such as chemicals or irritants in the air.  Infection from fungi (rare). What increases the risk? You are more likely to develop this condition if you:  Have a weak body defense system (immune system).  Do a lot of swimming or diving.  Overuse nasal sprays.  Smoke. What are the signs or symptoms? The main symptoms of this condition are pain and a feeling of pressure around the affected sinuses. Other symptoms include:  Stuffy nose or congestion.  Thick drainage from your nose.  Swelling and warmth over the affected sinuses.  Headache.  Upper toothache.  A cough that may get worse at night.  Extra mucus that collects in the throat or the back of the nose (postnasal drip).  Decreased sense of smell and taste.  Fatigue.  A fever.  Sore throat.  Bad breath. How is this diagnosed? This condition is diagnosed based on:  Your symptoms.  Your medical history.  A physical exam.  Tests to find out if your condition is acute or chronic. This may include: ? Checking your nose for nasal polyps. ? Viewing your sinuses using a device that has a light (endoscope). ? Testing for allergies or bacteria. ? Imaging tests, such as an MRI or CT scan. In rare cases, a bone biopsy may be done to rule out more serious types of fungal sinus disease. How is this treated? Treatment for sinusitis depends on the cause and whether your condition is chronic or acute.  If caused by a virus, your symptoms should go away on their own within 10 days. You may be given medicines to relieve symptoms. They include: ? Medicines that shrink swollen nasal passages (topical intranasal decongestants). ? Medicines  that treat allergies (antihistamines). ? A spray that eases inflammation of the nostrils (topical intranasal corticosteroids). ? Rinses that help get rid of thick mucus in your nose (nasal saline washes).  If caused by bacteria, your health care provider may recommend waiting to see if your symptoms improve. Most bacterial infections will get better without antibiotic medicine. You may be given antibiotics if you have: ? A severe infection. ? A weak immune system.  If caused by narrow nasal passages or nasal polyps, you may need to have surgery. Follow these instructions at home: Medicines  Take, use, or apply over-the-counter and prescription medicines only as told by your health care provider. These may include nasal sprays.  If you were prescribed an antibiotic medicine, take it as told by your health care provider. Do not stop taking the antibiotic even if you start to feel better. Hydrate and humidify  Drink enough fluid to keep your urine pale yellow. Staying hydrated will  help to thin your mucus.  Use a cool mist humidifier to keep the humidity level in your home above 50%.  Inhale steam for 10-15 minutes, 3-4 times a day, or as told by your health care provider. You can do this in the bathroom while a hot shower is running.  Limit your exposure to cool or dry air.   Rest  Rest as much as possible.  Sleep with your head raised (elevated).  Make sure you get enough sleep each night. General instructions  Apply a warm, moist washcloth to your face 3-4 times a day or as told by your health care provider. This will help with discomfort.  Wash your hands often with soap and water to reduce your exposure to germs. If soap and water are not available, use hand sanitizer.  Do not smoke. Avoid being around people who are smoking (secondhand smoke).  Keep all follow-up visits as told by your health care provider. This is important.   Contact a health care provider if:  You have  a fever.  Your symptoms get worse.  Your symptoms do not improve within 10 days. Get help right away if:  You have a severe headache.  You have persistent vomiting.  You have severe pain or swelling around your face or eyes.  You have vision problems.  You develop confusion.  Your neck is stiff.  You have trouble breathing. Summary  Sinusitis is soreness and inflammation of your sinuses. Sinuses are hollow spaces in the bones around your face.  This condition is caused by nasal tissues that become inflamed or swollen. The swelling traps or blocks the flow of mucus. This allows bacteria, viruses, and fungi to grow, which leads to infection.  If you were prescribed an antibiotic medicine, take it as told by your health care provider. Do not stop taking the antibiotic even if you start to feel better.  Keep all follow-up visits as told by your health care provider. This is important. This information is not intended to replace advice given to you by your health care provider. Make sure you discuss any questions you have with your health care provider. Document Revised: 01/29/2018 Document Reviewed: 01/29/2018 Elsevier Patient Education  2021 [employer].

## 2020-11-28 LAB — SARS-COV-2, NAA 2 DAY TAT

## 2020-11-28 LAB — NOVEL CORONAVIRUS, NAA: SARS-CoV-2, NAA: NOT DETECTED

## 2022-04-04 DIAGNOSIS — R112 Nausea with vomiting, unspecified: Secondary | ICD-10-CM | POA: Diagnosis not present

## 2022-05-23 DIAGNOSIS — K529 Noninfective gastroenteritis and colitis, unspecified: Secondary | ICD-10-CM | POA: Diagnosis not present

## 2022-07-28 DIAGNOSIS — J029 Acute pharyngitis, unspecified: Secondary | ICD-10-CM | POA: Diagnosis not present

## 2022-09-07 DIAGNOSIS — R110 Nausea: Secondary | ICD-10-CM | POA: Diagnosis not present

## 2022-09-23 DIAGNOSIS — A09 Infectious gastroenteritis and colitis, unspecified: Secondary | ICD-10-CM | POA: Diagnosis not present

## 2023-06-14 ENCOUNTER — Ambulatory Visit: Payer: Self-pay | Admitting: Family Medicine

## 2023-07-21 ENCOUNTER — Other Ambulatory Visit: Payer: Self-pay | Admitting: Medical Genetics

## 2023-07-21 DIAGNOSIS — Z006 Encounter for examination for normal comparison and control in clinical research program: Secondary | ICD-10-CM

## 2023-08-11 ENCOUNTER — Emergency Department (HOSPITAL_COMMUNITY)
Admission: EM | Admit: 2023-08-11 | Discharge: 2023-08-11 | Disposition: A | Discharge status: Against Medical Advice | Payer: BC Managed Care – PPO | Attending: Emergency Medicine | Admitting: Emergency Medicine

## 2023-08-11 ENCOUNTER — Encounter (HOSPITAL_COMMUNITY): Payer: Self-pay

## 2023-08-11 ENCOUNTER — Other Ambulatory Visit: Payer: Self-pay

## 2023-08-11 DIAGNOSIS — R197 Diarrhea, unspecified: Secondary | ICD-10-CM | POA: Diagnosis not present

## 2023-08-11 DIAGNOSIS — R109 Unspecified abdominal pain: Secondary | ICD-10-CM | POA: Diagnosis present

## 2023-08-11 DIAGNOSIS — Z5321 Procedure and treatment not carried out due to patient leaving prior to being seen by health care provider: Secondary | ICD-10-CM | POA: Diagnosis not present

## 2023-08-11 LAB — LIPASE, BLOOD: Lipase: 36 U/L (ref 11–51)

## 2023-08-11 LAB — CBC
HCT: 44.4 % (ref 39.0–52.0)
Hemoglobin: 14.9 g/dL (ref 13.0–17.0)
MCH: 28.9 pg (ref 26.0–34.0)
MCHC: 33.6 g/dL (ref 30.0–36.0)
MCV: 86.2 fL (ref 80.0–100.0)
Platelets: 223 10*3/uL (ref 150–400)
RBC: 5.15 MIL/uL (ref 4.22–5.81)
RDW: 13 % (ref 11.5–15.5)
WBC: 9.5 10*3/uL (ref 4.0–10.5)
nRBC: 0 % (ref 0.0–0.2)

## 2023-08-11 LAB — COMPREHENSIVE METABOLIC PANEL
ALT: 18 U/L (ref 0–44)
AST: 19 U/L (ref 15–41)
Albumin: 4.6 g/dL (ref 3.5–5.0)
Alkaline Phosphatase: 64 U/L (ref 38–126)
Anion gap: 9 (ref 5–15)
BUN: 12 mg/dL (ref 6–20)
CO2: 26 mmol/L (ref 22–32)
Calcium: 9.5 mg/dL (ref 8.9–10.3)
Chloride: 103 mmol/L (ref 98–111)
Creatinine, Ser: 0.99 mg/dL (ref 0.61–1.24)
GFR, Estimated: >60 mL/min (ref >60)
Glucose, Bld: 128 mg/dL — ABNORMAL HIGH (ref 70–99)
Potassium: 3.7 mmol/L (ref 3.5–5.1)
Sodium: 138 mmol/L (ref 135–145)
Total Bilirubin: 0.9 mg/dL (ref <1.2)
Total Protein: 7.2 g/dL (ref 6.5–8.1)

## 2023-08-11 NOTE — ED Triage Notes (Signed)
Pt reports right side abd pain with some diarrhea, denies any nausea or vomiting.

## 2024-03-13 ENCOUNTER — Emergency Department (HOSPITAL_COMMUNITY)

## 2024-03-13 ENCOUNTER — Emergency Department (HOSPITAL_COMMUNITY)
Admission: EM | Admit: 2024-03-13 | Discharge: 2024-03-13 | Disposition: A | Discharge status: Home / Self Care | Payer: Worker's Compensation | Source: Other Acute Inpatient Hospital | Attending: Emergency Medicine | Admitting: Emergency Medicine

## 2024-03-13 DIAGNOSIS — Y990 Civilian activity done for income or pay: Secondary | ICD-10-CM | POA: Diagnosis not present

## 2024-03-13 DIAGNOSIS — S6991XA Unspecified injury of right wrist, hand and finger(s), initial encounter: Secondary | ICD-10-CM | POA: Diagnosis present

## 2024-03-13 DIAGNOSIS — S61312A Laceration without foreign body of right middle finger with damage to nail, initial encounter: Secondary | ICD-10-CM | POA: Diagnosis not present

## 2024-03-13 DIAGNOSIS — W268XXA Contact with other sharp object(s), not elsewhere classified, initial encounter: Secondary | ICD-10-CM | POA: Insufficient documentation

## 2024-03-13 DIAGNOSIS — Z23 Encounter for immunization: Secondary | ICD-10-CM | POA: Insufficient documentation

## 2024-03-13 MED ORDER — TETANUS-DIPHTH-ACELL PERTUSSIS 5-2.5-18.5 LF-MCG/0.5 IM SUSY
0.5000 mL | PREFILLED_SYRINGE | Freq: Once | INTRAMUSCULAR | Status: AC
  Administered 2024-03-13: 0.5 mL via INTRAMUSCULAR
  Filled 2024-03-13: qty 0.5

## 2024-03-13 MED ORDER — AMOXICILLIN-POT CLAVULANATE 875-125 MG PO TABS: 1.0000 | ORAL_TABLET | Freq: Two times a day (BID) | ORAL | 0 refills | Status: DC

## 2024-03-13 MED ORDER — ONDANSETRON 4 MG PO TBDP
4.0000 mg | ORAL_TABLET | Freq: Once | ORAL | Status: AC
  Administered 2024-03-13: 4 mg via ORAL
  Filled 2024-03-13: qty 1

## 2024-03-13 MED ORDER — BUPIVACAINE HCL (PF) 0.25 % IJ SOLN
5.0000 mL | Freq: Once | INTRAMUSCULAR | Status: AC
  Administered 2024-03-13: 5 mL
  Filled 2024-03-13: qty 30

## 2024-03-13 MED ORDER — OXYCODONE-ACETAMINOPHEN 5-325 MG PO TABS
1.0000 | ORAL_TABLET | Freq: Once | ORAL | Status: AC
  Administered 2024-03-13: 1 via ORAL
  Filled 2024-03-13: qty 1

## 2024-03-13 NOTE — ED Provider Notes (Signed)
 [place] EMERGENCY DEPARTMENT AT [facility] Provider Note   CSN: [id number] Arrival date & time: 03/13/24  9092     Patient presents with: Finger Injury   Jeff Moreno is a 23 y.o. male.  Patient without significant medical history presents to the emergency department with concerns of a finger injury.  He reports that he was at work when he sustained a laceration to the right middle finger with complete nail avulsion.  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  Not on blood thinners or any other medications.  Endorses severe pain to this area.  Has felt somewhat lightheaded since the injury but no reported syncope or loss of consciousness.  HPI     Prior to Admission medications   Medication Sig Start Date End Date Taking? Authorizing Provider  amoxicillin -clavulanate (AUGMENTIN) 875-125 MG tablet Take 1 tablet by mouth every 12 (twelve) hours. 03/13/24  Yes Jermale Crass A, PA-C  doxycycline  (VIBRA -TABS) 100 MG tablet Take 1 tablet (100 mg total) by mouth 2 (two) times daily. 11/26/20   Booker Darice SAUNDERS, FNP    Allergies: Duricef [cefadroxil]    Review of Systems  Skin:        Nail avulsion  All other systems reviewed and are negative.   Updated Vital Signs BP 115/80 (BP Location: Left Arm)   Pulse 61   Temp 98.2 F (36.8 C) (Oral)   Resp 15   SpO2 100%   Physical Exam Vitals and nursing note reviewed.  Constitutional:      General: He is not in acute distress.    Appearance: He is well-developed.  HENT:     Head: Normocephalic and atraumatic.  Eyes:     Conjunctiva/sclera: Conjunctivae normal.  Cardiovascular:     Rate and Rhythm: Normal rate and regular rhythm.     Heart sounds: No murmur heard. Pulmonary:     Effort: Pulmonary effort is normal. No respiratory distress.     Breath sounds: Normal breath sounds.  Abdominal:     Palpations: Abdomen is soft.     Tenderness: There is no  abdominal tenderness.  Musculoskeletal:        General: No swelling.     Cervical back: Neck supple.  Skin:    General: Skin is warm and dry.     Capillary Refill: Capillary refill takes less than 2 seconds.     Findings: Lesion present.     Comments: Complete nail avulsion to the right 3rd digit. Minimal bleeding. No clear nail bed lacerations. Index finger with superficial lesions.  Neurological:     Mental Status: He is alert.  Psychiatric:        Mood and Affect: Mood normal.     (all labs ordered are listed, but only abnormal results are displayed) Labs Reviewed - No data to display  EKG: None  Radiology: DG Hand Complete Right Result Date: 03/13/2024 CLINICAL DATA:  Laceration of the right middle finger. EXAM: RIGHT HAND - COMPLETE 3+ VIEW COMPARISON:  08/16/2016. FINDINGS: No acute fracture or dislocation. Joint spaces are preserved. Laceration of the distal third digit. No radiopaque foreign body. IMPRESSION: 1. No acute osseous abnormality. 2. Laceration of the distal third digit. No radiopaque foreign body. Electronically Signed   By: Harrietta Sherry M.D.   On: 03/13/2024 10:09     .Laceration Repair  Date/Time: 03/13/2024 10:59 AM  Performed by: Honesti Seaberg A,  PA-C Authorized by: Jencarlo Bonadonna A, PA-C   Consent:    Consent obtained:  Verbal   Consent given by:  Patient   Risks, benefits, and alternatives were discussed: yes     Risks discussed:  Infection, pain and poor cosmetic result   Alternatives discussed:  Referral Universal protocol:    Patient identity confirmed:  Verbally with patient, provided demographic data and arm band Anesthesia:    Anesthesia method:  Nerve block   Block location:  Right third digit   Block needle gauge:  27 G   Block anesthetic:  Bupivacaine 0.25% w/o epi   Block technique:  Digital block   Block injection procedure:  Anatomic landmarks identified, introduced needle, incremental injection, anatomic landmarks palpated and  negative aspiration for blood   Block outcome:  Anesthesia achieved Laceration details:    Location:  Finger   Finger location:  R long finger   Length (cm):  2   Depth (mm):  4 Pre-procedure details:    Preparation:  Imaging obtained to evaluate for foreign bodies Exploration:    Limited defect created (wound extended): yes     Imaging obtained: x-ray     Imaging outcome: foreign body not noted     Wound extent: fascia violated     Contaminated: yes   Treatment:    Area cleansed with:  Povidone-iodine  and saline   Amount of cleaning:  Extensive   Irrigation solution:  Sterile saline   Irrigation volume:  1000cc   Irrigation method:  Syringe   Visualized foreign bodies/material removed: no     Debridement:  Minimal Skin repair:    Repair method:  Sutures   Suture size:  5-0   Suture material:  Fast-absorbing gut   Suture technique:  Simple interrupted   Number of sutures:  1 Approximation:    Approximation:  Loose Repair type:    Repair type:  Intermediate Post-procedure details:    Dressing:  Bulky dressing   Procedure completion:  Tolerated    Medications Ordered in the ED  Tdap (BOOSTRIX) injection 0.5 mL (0.5 mLs Intramuscular Given 03/13/24 0941)  oxyCODONE-acetaminophen  (PERCOCET/ROXICET) 5-325 MG per tablet 1 tablet (1 tablet Oral Given 03/13/24 0940)  ondansetron  (ZOFRAN -ODT) disintegrating tablet 4 mg (4 mg Oral Given 03/13/24 0940)  bupivacaine (PF) (MARCAINE) 0.25 % injection 5 mL (5 mLs Infiltration Given by Other 03/13/24 1017)                                    Medical Decision Making Amount and/or Complexity of Data Reviewed Radiology: ordered.  Risk Prescription drug management.   This patient presents to the ED for concern of finger injury.  Differential diagnosis includes nail avulsion, distal tuft fracture, comminuted fracture, skin avulsion   Imaging Studies ordered:  I ordered imaging studies including x-ray of the right hand I independently  visualized and interpreted imaging which showed negative for any signs of any fracture or dislocation but notable soft tissue injury with laceration present I agree with the radiologist interpretation   Medicines ordered and prescription drug management:  I ordered medication including Tdap, Percocet, Zofran , bupivacaine for immunization, pain, nausea, nerve block Reevaluation of the patient after these medicines showed that the patient improved I have reviewed the patients home medicines and have made adjustments as needed   Problem List / ED Course:  Patient presents to the emergency department following a finger injury.  He reports that while he was at work, the belt of a golf cart turned on resulting in injury to his right middle finger.  Significant nail injury seen in initial evaluation.  Does not believe he is up-to-date on his tetanus. On exam, patient has notable nail avulsion of the right middle finger.  Appears there is also likely soft tissue injury with avulsion of the nail. Fingertip appears to have soft tissue avulsion as well. No clearly visible bone. Xray negative. Repair performed with stenting of the nail bed as skin has no significant area that can easily be repaired given orientation of lesions. Will require hand surgery evaluation. With concern for contamination, will start on antibiotics. Allergy to cephalosporins but has tolerated PCN without trouble. Augmentin prescribed. Advised strict return precautions such as concerns for new or worsening symptoms. Discharged home in stable condition.  Final diagnoses:  Laceration of right middle finger without foreign body with damage to nail, initial encounter    ED Discharge Orders          Ordered    amoxicillin -clavulanate (AUGMENTIN) 875-125 MG tablet  Every 12 hours        03/13/24 1053               Bradyn Vassey A, PA-C 03/13/24 1105    Freddi Hamilton, MD 03/13/24 1457

## 2024-03-13 NOTE — ED Triage Notes (Signed)
 Pt arriving POV from work with lac to right middle finger. 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 Bleeding controlled at this time.

## 2024-03-13 NOTE — Discharge Instructions (Signed)
 You were seen in the ER today for concerns of a finger injury. You unfortunately had your nail bed removed off as well as a portion of the end of your finger. You had repair performed with stenting the nail bed to allow for possible nail regrowth. You need to be seen by a hand surgeon for this injury. Please call their office and be seen in the next 1-2 days if possible. I have sent you antibiotics to help manage your symptoms. Return to the ER for any concerns of new or worsening symptoms.

## 2024-06-21 ENCOUNTER — Other Ambulatory Visit: Payer: Self-pay | Admitting: Medical Genetics

## 2024-06-21 DIAGNOSIS — Z006 Encounter for examination for normal comparison and control in clinical research program: Secondary | ICD-10-CM

## 2024-10-09 ENCOUNTER — Ambulatory Visit
Admission: EM | Admit: 2024-10-09 | Discharge: 2024-10-09 | Disposition: A | Discharge status: Home / Self Care | Attending: Family Medicine | Admitting: Family Medicine

## 2024-10-09 DIAGNOSIS — B001 Herpesviral vesicular dermatitis: Secondary | ICD-10-CM | POA: Diagnosis not present

## 2024-10-09 DIAGNOSIS — K644 Residual hemorrhoidal skin tags: Secondary | ICD-10-CM | POA: Diagnosis not present

## 2024-10-09 MED ORDER — VALACYCLOVIR HCL 1 G PO TABS: 1000.0000 mg | ORAL_TABLET | Freq: Two times a day (BID) | ORAL | 0 refills | Status: AC

## 2024-10-09 MED ORDER — HYDROCORTISONE (PERIANAL) 2.5 % EX CREA: 1.0000 | TOPICAL_CREAM | Freq: Two times a day (BID) | CUTANEOUS | 0 refills | Status: AC

## 2024-10-09 NOTE — ED Provider Notes (Signed)
 " [facility]    CSN: [id number] Arrival date & time: 10/09/24  1839      History   Chief Complaint No chief complaint on file.   HPI Jeff Moreno is a 24 y.o. male.   Patient presenting today with 2-day history of anal pain and swelling, bright red blood with wiping, straining with bowel movements.  Denies abdominal pain, urinary symptoms, anal discharge, fevers, chills, history of similar.  So far has not tried anything over-the-counter for symptoms, he wanted confirmation that it was hemorrhoids.  He has also been dealing with frequent cold sores on his lip and requesting a medication for this.  Notes this has been an ongoing issue for many years for him.    Past Medical History:  Diagnosis Date   ADD (attention deficit disorder)    Asthma    Chronic constipation     Patient Active Problem List   Diagnosis Date Noted   Acute bacterial rhinosinusitis 11/26/2020   Nausea and vomiting in adult 07/20/2020   Insomnia 04/17/2017   Headache, common migraine 11/08/2015   ADD (attention deficit disorder) 08/10/2015   Acne vulgaris 05/19/2013    History reviewed. No pertinent surgical history.     Home Medications    Prior to Admission medications  Medication Sig Start Date End Date Taking? Authorizing Provider  hydrocortisone  (ANUSOL -HC) 2.5 % rectal cream Place 1 Application rectally 2 (two) times daily. 10/09/24  Yes Stuart Vernell Norris, PA-C  valACYclovir  (VALTREX ) 1000 MG tablet Take 1 tablet (1,000 mg total) by mouth 2 (two) times daily for 7 days. 10/09/24 10/16/24 Yes Stuart Vernell Norris, PA-C    Family History History reviewed. No pertinent family history.  Social History Social History[1]   Allergies   Duricef [cefadroxil]   Review of Systems Review of Systems Per HPI  Physical Exam Triage Vital Signs ED Triage Vitals  Encounter Vitals Group     BP 10/09/24 1849 125/77     Girls Systolic BP Percentile --      Girls Diastolic BP  Percentile --      Boys Systolic BP Percentile --      Boys Diastolic BP Percentile --      Pulse Rate 10/09/24 1849 82     Resp 10/09/24 1849 20     Temp 10/09/24 1849 98.6 F (37 C)     Temp Source 10/09/24 1849 Oral     SpO2 10/09/24 1849 98 %     Weight --      Height --      Head Circumference --      Peak Flow --      Pain Score 10/09/24 1853 6     Pain Loc --      Pain Education --      Exclude from Growth Chart --    No data found.  Updated Vital Signs BP 125/77 (BP Location: Right Arm)   Pulse 82   Temp 98.6 F (37 C) (Oral)   Resp 20   SpO2 98%   Visual Acuity Right Eye Distance:   Left Eye Distance:   Bilateral Distance:    Right Eye Near:   Left Eye Near:    Bilateral Near:     Physical Exam Vitals and nursing note reviewed. Exam conducted with a chaperone present.  Constitutional:      Appearance: Normal appearance.  HENT:     Head: Atraumatic.  Eyes:     Extraocular Movements: Extraocular movements intact.  Conjunctiva/sclera: Conjunctivae normal.  Cardiovascular:     Rate and Rhythm: Normal rate.  Pulmonary:     Effort: Pulmonary effort is normal.  Genitourinary:    Comments: External hemorrhoids present, no appreciable fissures, abscesses in the anal rectal area Musculoskeletal:        General: Normal range of motion.     Cervical back: Normal range of motion and neck supple.  Skin:    General: Skin is warm and dry.     Comments: Healing cold sore present to the left upper lip  Neurological:     Mental Status: He is oriented to person, place, and time.  Psychiatric:        Mood and Affect: Mood normal.        Thought Content: Thought content normal.        Judgment: Judgment normal.      UC Treatments / Results  Labs (all labs ordered are listed, but only abnormal results are displayed) Labs Reviewed - No data to display  EKG   Radiology No results found.  Procedures Procedures (including critical care  time)  Medications Ordered in UC Medications - No data to display  Initial Impression / Assessment and Plan / UC Course  I have reviewed the triage vital signs and the nursing notes.  Pertinent labs & imaging results that were available during my care of the patient were reviewed by me and considered in my medical decision making (see chart for details).     Treat cold sore with Valtrex , discussed topical care additionally as needed.  Anusol , Tucks wipes, sitz bath's, bowel regimen for external hemorrhoids.  Return for worsening or unresolving symptoms.  Final Clinical Impressions(s) / UC Diagnoses   Final diagnoses:  External hemorrhoids  Cold sore   Discharge Instructions   None    ED Prescriptions     Medication Sig Dispense Auth. Provider   valACYclovir  (VALTREX ) 1000 MG tablet Take 1 tablet (1,000 mg total) by mouth 2 (two) times daily for 7 days. 14 tablet Stuart Vernell Norris, PA-C   hydrocortisone  (ANUSOL -HC) 2.5 % rectal cream Place 1 Application rectally 2 (two) times daily. 80 g Stuart Vernell Norris, [place]      PDMP not reviewed this encounter.    [1]  Social History Tobacco Use   Smoking status: Former   Smokeless tobacco: Never  Vaping Use   Vaping status: Every Day  Substance Use Topics   Alcohol use: Not Currently    Comment: occ   Drug use: Yes    Frequency: 2.0 times per week    Types: Marijuana     Stuart Vernell Norris, [place] 10/09/24 1934  "

## 2024-10-09 NOTE — ED Triage Notes (Signed)
 Pt reports pain and discomfort in the rectum, x 2 days, sore on the motuh x 4 days.

## 2024-10-22 ENCOUNTER — Ambulatory Visit: Admitting: Family Medicine
